# Patient Record
Sex: Female | Born: 1990 | Race: Black or African American | Hispanic: No | Marital: Single | Smoking: Current some day smoker
Health system: Southern US, Community
[De-identification: ages and names within clinical notes are randomized; demographics above are authoritative.]

## PROBLEM LIST (undated history)

## (undated) DIAGNOSIS — Z789 Other specified health status: Secondary | ICD-10-CM

## (undated) HISTORY — PX: NO PAST SURGERIES: SHX2092

---
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
## 2009-04-16 ENCOUNTER — Emergency Department (HOSPITAL_COMMUNITY): Admission: EM | Admit: 2009-04-16 | Discharge: 2009-04-16 | Payer: Self-pay | Admitting: Emergency Medicine

## 2009-07-22 ENCOUNTER — Inpatient Hospital Stay (HOSPITAL_COMMUNITY): Admission: AD | Admit: 2009-07-22 | Discharge: 2009-07-22 | Payer: Self-pay | Admitting: Obstetrics and Gynecology

## 2009-07-22 ENCOUNTER — Ambulatory Visit: Payer: Self-pay | Admitting: Obstetrics and Gynecology

## 2009-10-22 ENCOUNTER — Emergency Department (HOSPITAL_COMMUNITY): Admission: EM | Admit: 2009-10-22 | Discharge: 2009-10-22 | Payer: Self-pay | Admitting: Emergency Medicine

## 2009-12-11 ENCOUNTER — Emergency Department (HOSPITAL_COMMUNITY): Admission: EM | Admit: 2009-12-11 | Discharge: 2009-12-11 | Payer: Self-pay | Admitting: Emergency Medicine

## 2010-01-06 ENCOUNTER — Emergency Department (HOSPITAL_COMMUNITY): Admission: EM | Admit: 2010-01-06 | Discharge: 2010-01-06 | Payer: Self-pay | Admitting: Emergency Medicine

## 2010-04-01 ENCOUNTER — Ambulatory Visit: Payer: Self-pay | Admitting: Obstetrics and Gynecology

## 2010-04-01 ENCOUNTER — Inpatient Hospital Stay (HOSPITAL_COMMUNITY): Admission: AD | Admit: 2010-04-01 | Discharge: 2010-04-01 | Payer: Self-pay | Admitting: Obstetrics and Gynecology

## 2010-10-09 ENCOUNTER — Emergency Department (HOSPITAL_COMMUNITY)
Admission: EM | Admit: 2010-10-09 | Discharge: 2010-10-09 | Disposition: A | Discharge status: Home / Self Care | Payer: Self-pay | Attending: Emergency Medicine | Admitting: Emergency Medicine

## 2010-10-09 DIAGNOSIS — T169XXA Foreign body in ear, unspecified ear, initial encounter: Secondary | ICD-10-CM | POA: Insufficient documentation

## 2010-10-09 DIAGNOSIS — H6120 Impacted cerumen, unspecified ear: Secondary | ICD-10-CM | POA: Insufficient documentation

## 2010-10-09 DIAGNOSIS — IMO0002 Reserved for concepts with insufficient information to code with codable children: Secondary | ICD-10-CM | POA: Insufficient documentation

## 2010-10-10 LAB — URINALYSIS, ROUTINE W REFLEX MICROSCOPIC
Bilirubin Urine: NEGATIVE
Ketones, ur: NEGATIVE mg/dL
Protein, ur: NEGATIVE mg/dL
Urobilinogen, UA: 0.2 mg/dL (ref 0.0–1.0)

## 2010-10-10 LAB — WET PREP, GENITAL
Trich, Wet Prep: NONE SEEN
Yeast Wet Prep HPF POC: NONE SEEN

## 2010-10-10 LAB — POCT PREGNANCY, URINE: Preg Test, Ur: NEGATIVE

## 2010-10-10 LAB — GC/CHLAMYDIA PROBE AMP, GENITAL: GC Probe Amp, Genital: NEGATIVE

## 2010-10-14 LAB — URINALYSIS, ROUTINE W REFLEX MICROSCOPIC
Glucose, UA: NEGATIVE mg/dL
Hgb urine dipstick: NEGATIVE
Ketones, ur: NEGATIVE mg/dL
Nitrite: NEGATIVE
Urobilinogen, UA: 1 mg/dL (ref 0.0–1.0)

## 2010-10-14 LAB — WET PREP, GENITAL

## 2010-10-14 LAB — GC/CHLAMYDIA PROBE AMP, GENITAL: Chlamydia, DNA Probe: NEGATIVE

## 2010-10-14 LAB — URINE MICROSCOPIC-ADD ON

## 2010-10-20 LAB — RAPID STREP SCREEN (MED CTR MEBANE ONLY): Streptococcus, Group A Screen (Direct): NEGATIVE

## 2010-10-28 LAB — POCT PREGNANCY, URINE: Preg Test, Ur: NEGATIVE

## 2010-10-28 LAB — HIV ANTIBODY (ROUTINE TESTING W REFLEX): HIV: NONREACTIVE

## 2010-10-28 LAB — RPR: RPR Ser Ql: NONREACTIVE

## 2010-10-28 LAB — WET PREP, GENITAL

## 2010-10-28 LAB — HSV(HERPES SIMPLEX VRS) I + II AB-IGG: Herpes Simplex Vrs I + II Ab, IgG: 1.3 IV — ABNORMAL HIGH

## 2010-10-28 LAB — GC/CHLAMYDIA PROBE AMP, URINE: Chlamydia, Swab/Urine, PCR: NEGATIVE

## 2011-01-20 ENCOUNTER — Emergency Department (HOSPITAL_COMMUNITY)
Admission: EM | Admit: 2011-01-20 | Discharge: 2011-01-20 | Disposition: A | Discharge status: Home / Self Care | Payer: Self-pay | Attending: Emergency Medicine | Admitting: Emergency Medicine

## 2011-01-20 ENCOUNTER — Emergency Department (HOSPITAL_COMMUNITY): Payer: Self-pay

## 2011-01-20 DIAGNOSIS — T148XXA Other injury of unspecified body region, initial encounter: Secondary | ICD-10-CM | POA: Insufficient documentation

## 2011-01-20 DIAGNOSIS — R079 Chest pain, unspecified: Secondary | ICD-10-CM | POA: Insufficient documentation

## 2011-01-20 DIAGNOSIS — S0180XA Unspecified open wound of other part of head, initial encounter: Secondary | ICD-10-CM | POA: Insufficient documentation

## 2011-01-29 ENCOUNTER — Emergency Department (HOSPITAL_COMMUNITY)
Admission: EM | Admit: 2011-01-29 | Discharge: 2011-01-29 | Disposition: A | Discharge status: Home / Self Care | Payer: Self-pay | Attending: Emergency Medicine | Admitting: Emergency Medicine

## 2011-01-29 DIAGNOSIS — T148XXA Other injury of unspecified body region, initial encounter: Secondary | ICD-10-CM | POA: Insufficient documentation

## 2011-01-29 DIAGNOSIS — Z4802 Encounter for removal of sutures: Secondary | ICD-10-CM | POA: Insufficient documentation

## 2011-04-18 LAB — POCT PREGNANCY, URINE
Operator id: 196461
Preg Test, Ur: NEGATIVE

## 2011-04-18 LAB — GC/CHLAMYDIA PROBE AMP, GENITAL
Chlamydia, DNA Probe: NEGATIVE
GC Probe Amp, Genital: POSITIVE — AB

## 2011-04-18 LAB — URINALYSIS, ROUTINE W REFLEX MICROSCOPIC
Bilirubin Urine: NEGATIVE
Hgb urine dipstick: NEGATIVE
Nitrite: NEGATIVE
Protein, ur: NEGATIVE
Urobilinogen, UA: 1

## 2011-04-18 LAB — RPR: RPR Ser Ql: NONREACTIVE

## 2011-04-24 LAB — URINE CULTURE: Colony Count: 25000

## 2011-04-24 LAB — POCT PREGNANCY, URINE: Preg Test, Ur: NEGATIVE

## 2011-04-24 LAB — URINALYSIS, ROUTINE W REFLEX MICROSCOPIC
Glucose, UA: NEGATIVE
Hgb urine dipstick: NEGATIVE
Protein, ur: NEGATIVE
Specific Gravity, Urine: 1.023
pH: 7

## 2011-04-24 LAB — GC/CHLAMYDIA PROBE AMP, GENITAL: Chlamydia, DNA Probe: NEGATIVE

## 2011-04-30 LAB — GC/CHLAMYDIA PROBE AMP, GENITAL
Chlamydia, DNA Probe: NEGATIVE
GC Probe Amp, Genital: NEGATIVE

## 2011-04-30 LAB — WET PREP, GENITAL
Clue Cells Wet Prep HPF POC: NONE SEEN
Trich, Wet Prep: NONE SEEN

## 2011-09-08 ENCOUNTER — Inpatient Hospital Stay (HOSPITAL_COMMUNITY)
Admission: AD | Admit: 2011-09-08 | Discharge: 2011-09-08 | Disposition: A | Discharge status: Home / Self Care | Payer: Self-pay | Source: Ambulatory Visit | Attending: Obstetrics & Gynecology | Admitting: Obstetrics & Gynecology

## 2011-09-08 DIAGNOSIS — R109 Unspecified abdominal pain: Secondary | ICD-10-CM | POA: Insufficient documentation

## 2011-09-08 DIAGNOSIS — Z5320 Procedure and treatment not carried out because of patient's decision for unspecified reasons: Secondary | ICD-10-CM | POA: Insufficient documentation

## 2011-09-08 LAB — URINALYSIS, ROUTINE W REFLEX MICROSCOPIC
Bilirubin Urine: NEGATIVE
Glucose, UA: NEGATIVE mg/dL
Hgb urine dipstick: NEGATIVE
Protein, ur: NEGATIVE mg/dL
Urobilinogen, UA: 0.2 mg/dL (ref 0.0–1.0)

## 2011-09-08 NOTE — Progress Notes (Signed)
Pt left AMA, paper signed,  states she needs to go back to work, informed that she may return at any point

## 2011-09-08 NOTE — Progress Notes (Signed)
Bad pains in the bottom of stomach, started last wk, pain is off and on.

## 2011-09-23 ENCOUNTER — Inpatient Hospital Stay (HOSPITAL_COMMUNITY)
Admission: AD | Admit: 2011-09-23 | Discharge: 2011-09-23 | Disposition: A | Discharge status: Home / Self Care | Payer: Self-pay | Source: Ambulatory Visit | Attending: Family Medicine | Admitting: Family Medicine

## 2011-09-23 ENCOUNTER — Encounter (HOSPITAL_COMMUNITY): Payer: Self-pay

## 2011-09-23 DIAGNOSIS — J029 Acute pharyngitis, unspecified: Secondary | ICD-10-CM | POA: Insufficient documentation

## 2011-09-23 DIAGNOSIS — R197 Diarrhea, unspecified: Secondary | ICD-10-CM | POA: Insufficient documentation

## 2011-09-23 HISTORY — DX: Other specified health status: Z78.9

## 2011-09-23 LAB — URINE MICROSCOPIC-ADD ON

## 2011-09-23 LAB — URINALYSIS, ROUTINE W REFLEX MICROSCOPIC
Bilirubin Urine: NEGATIVE
Glucose, UA: NEGATIVE mg/dL
Ketones, ur: NEGATIVE mg/dL
Specific Gravity, Urine: 1.015 (ref 1.005–1.030)
pH: 6 (ref 5.0–8.0)

## 2011-09-23 MED ORDER — AMOXICILLIN 500 MG PO CAPS: 500.0000 mg | ORAL_CAPSULE | Freq: Three times a day (TID) | ORAL | Status: AC

## 2011-09-23 NOTE — Discharge Instructions (Signed)

## 2011-09-23 NOTE — ED Provider Notes (Signed)
Chart reviewed and agree with management and plan.  

## 2011-09-23 NOTE — Progress Notes (Signed)
Pt states has had sore throat x1 week, and "sinus like" symptoms since Friday. Has taken sinus medication and advil since Friday but states it hasn't helped. This morning started having sharp lower abdominal pain. Denies vaginal bleeding or vaginal discharge.

## 2011-09-23 NOTE — Progress Notes (Signed)
Patient states she has had a sore throat for about one week, no fever and slight cough. Started having lower abdominal pain this morning. Denies any bleeding. Had cold like symptoms.

## 2011-09-23 NOTE — ED Provider Notes (Signed)
History   Pt presents today c/o a sore throat that has worsened over the past week. She denies fever or N&V. She did have one episode of diarrhea this am which is when she noticed some abd cramping. She denies pain in her pelvis. She denies vag dc, bleeding, or any other sx at this time.  Chief Complaint  Patient presents with  . Sore Throat  . Abdominal Pain   HPI  OB History    Grav Para Term Preterm Abortions TAB SAB Ect Mult Living   0 0 0 0 0 0 0 0 0 0       Past Medical History  Diagnosis Date  . No pertinent past medical history     Past Surgical History  Procedure Date  . No past surgeries     Family History  Problem Relation Age of Onset  . Anesthesia problems Neg Hx     History  Substance Use Topics  . Smoking status: Current Everyday Smoker    Types: Cigarettes  . Smokeless tobacco: Not on file  . Alcohol Use:     Allergies: No Known Allergies  No prescriptions prior to admission    Review of Systems  Constitutional: Negative for fever and chills.  HENT: Positive for sore throat.   Eyes: Negative for blurred vision and double vision.  Respiratory: Negative for cough, hemoptysis, sputum production, shortness of breath and wheezing.   Cardiovascular: Negative for chest pain and palpitations.  Gastrointestinal: Positive for abdominal pain and diarrhea. Negative for nausea, vomiting, constipation and blood in stool.  Genitourinary: Negative for dysuria, urgency, frequency and hematuria.  Neurological: Negative for dizziness and headaches.  Psychiatric/Behavioral: Negative for depression and suicidal ideas.   Physical Exam   Blood pressure 126/78, pulse 69, temperature 97.2 F (36.2 C), temperature source Oral, resp. rate 16, height 5\' 6"  (1.676 m), weight 184 lb 9.6 oz (83.734 kg), last menstrual period 09/18/2011, SpO2 97.00%.  Physical Exam  Nursing note and vitals reviewed. Constitutional: She is oriented to person, place, and time. She appears  well-developed and well-nourished. No distress.  HENT:  Head: Normocephalic and atraumatic.  Mouth/Throat: Uvula is midline. Oropharyngeal exudate and posterior oropharyngeal erythema present.  Eyes: Conjunctivae and EOM are normal. Pupils are equal, round, and reactive to light.  Cardiovascular: Normal rate, regular rhythm and normal heart sounds.  Exam reveals no gallop and no friction rub.   No murmur heard. Respiratory: Effort normal and breath sounds normal. No respiratory distress. She has no wheezes. She has no rales. She exhibits no tenderness.  GI: Soft. She exhibits no distension and no mass. There is no tenderness. There is no rebound and no guarding.  Neurological: She is alert and oriented to person, place, and time.  Skin: Skin is warm and dry. She is not diaphoretic.  Psychiatric: She has a normal mood and affect. Her behavior is normal. Judgment and thought content normal.    MAU Course  Procedures  Results for orders placed during the hospital encounter of 09/23/11 (from the past 72 hour(s))  POCT PREGNANCY, URINE     Status: Normal   Collection Time   09/23/11 10:57 AM      Component Value Range Comment   Preg Test, Ur NEGATIVE  NEGATIVE       Assessment and Plan  Pharyngitis: discussed with pt at length. Will tx with amoxicillin. She will f/u with her PCP. She may take Imodium for diarrhea. Discussed diet, activity, risks, and precautions.  Clinton Gallant.  Cheril Slattery III, DrHSc, MPAS, PA-C  09/23/2011, 10:57 AM   Henrietta Hoover, PA 09/23/11 1119

## 2012-03-09 ENCOUNTER — Inpatient Hospital Stay (HOSPITAL_COMMUNITY)
Admission: AD | Admit: 2012-03-09 | Discharge: 2012-03-09 | Disposition: A | Discharge status: Home / Self Care | Payer: Self-pay | Source: Ambulatory Visit | Attending: Obstetrics & Gynecology | Admitting: Obstetrics & Gynecology

## 2012-03-09 ENCOUNTER — Encounter (HOSPITAL_COMMUNITY): Payer: Self-pay | Admitting: *Deleted

## 2012-03-09 ENCOUNTER — Inpatient Hospital Stay (HOSPITAL_COMMUNITY): Payer: Self-pay

## 2012-03-09 DIAGNOSIS — S2341XA Sprain of ribs, initial encounter: Secondary | ICD-10-CM | POA: Insufficient documentation

## 2012-03-09 DIAGNOSIS — T148XXA Other injury of unspecified body region, initial encounter: Secondary | ICD-10-CM

## 2012-03-09 DIAGNOSIS — R109 Unspecified abdominal pain: Secondary | ICD-10-CM | POA: Insufficient documentation

## 2012-03-09 LAB — URINALYSIS, ROUTINE W REFLEX MICROSCOPIC
Bilirubin Urine: NEGATIVE
Hgb urine dipstick: NEGATIVE
Nitrite: NEGATIVE
Protein, ur: NEGATIVE mg/dL
Specific Gravity, Urine: 1.02 (ref 1.005–1.030)
Urobilinogen, UA: 1 mg/dL (ref 0.0–1.0)

## 2012-03-09 LAB — URINE MICROSCOPIC-ADD ON

## 2012-03-09 MED ORDER — OXYCODONE-ACETAMINOPHEN 5-325 MG PO TABS: 2.0000 | ORAL_TABLET | ORAL | Status: AC | PRN

## 2012-03-09 MED ORDER — OXYCODONE-ACETAMINOPHEN 5-325 MG PO TABS
1.0000 | ORAL_TABLET | Freq: Once | ORAL | Status: AC
  Administered 2012-03-09: 1 via ORAL
  Filled 2012-03-09: qty 1

## 2012-03-09 NOTE — MAU Provider Note (Signed)
History     CSN: [id number]  Arrival date and time: 03/09/12 2009   First Provider Initiated Contact with Patient 03/09/12 2128      Chief Complaint  Patient presents with  . Abdominal Pain   HPI Elizabeth Lynn is a 21 y.o. female who presents to MAU with right rib pain. She is not pregnant. The pain started last night. She describes the pain as sharp. The pain is worse with deep breath, pressure to the area and movement. The pain is better with remaining still. The patient rates the pain as 8/10. 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 The history was provided by the patient.  OB History    Grav Para Term Preterm Abortions TAB SAB Ect Mult Living   0 0 0 0 0 0 0 0 0 0       Past Medical History  Diagnosis Date  . No pertinent past medical history     Past Surgical History  Procedure Date  . No past surgeries     Family History  Problem Relation Age of Onset  . Anesthesia problems Neg Hx     History  Substance Use Topics  . Smoking status: Current Everyday Smoker    Types: Cigarettes  . Smokeless tobacco: Not on file  . Alcohol Use: No    Allergies: No Known Allergies  No prescriptions prior to admission    Review of Systems  Constitutional: Negative for fever, chills and weight loss.  HENT: Negative for ear pain, nosebleeds, congestion and sore throat.   Eyes: Negative for blurred vision, double vision, photophobia and pain.  Respiratory: Negative for cough, shortness of breath and wheezing.   Cardiovascular: Negative for chest pain, palpitations and leg swelling.  Gastrointestinal: Negative for heartburn, nausea, vomiting, abdominal pain, diarrhea and constipation.  Genitourinary: Negative for dysuria, urgency and frequency.  Musculoskeletal:       Right rib pain  Skin: Negative for itching and rash.  Neurological: Negative for dizziness, sensory change,  speech change, seizures, weakness and headaches.  Endo/Heme/Allergies: Does not bruise/bleed easily.  Psychiatric/Behavioral: Negative for depression. The patient is not nervous/anxious.    Physical Exam   Blood pressure 125/66, pulse 98, temperature 99.7 F (37.6 C), temperature source Oral, resp. rate 18, height 5\' 6"  (1.676 m), weight 186 lb (84.369 kg), last menstrual period 02/19/2012, SpO2 100.00%.  Physical Exam  Nursing note and vitals reviewed. Constitutional: She is oriented to person, place, and time. She appears well-developed and well-nourished. No distress.  HENT:  Head: Normocephalic and atraumatic.  Eyes: EOM are normal.  Neck: Neck supple.  Cardiovascular: Normal rate, regular rhythm and normal heart sounds.   Respiratory: Effort normal. No respiratory distress. She has no wheezes. She has no rales. She exhibits tenderness.    GI: Soft. There is no tenderness.  Musculoskeletal: Normal range of motion.  Neurological: She is alert and oriented to person, place, and time.  Skin: Skin is warm and dry.  Psychiatric: She has a normal mood and affect. Her behavior is normal. Judgment and thought content normal.   Assessment: Muscular strain  Plan:  Ibuprofen   Percocet   If pain continues go to [facility] or [facility] ED for further evaluation  MAU Course  Procedures Dg Ribs Unilateral W/chest Right  03/09/2012  *RADIOLOGY REPORT*  Clinical Data: No injury.  Sudden onset right lower  anterior rib pain.  RIGHT RIBS AND CHEST - 3+ VIEW  Comparison: 01/20/2011.  Findings: No displaced rib fractures identified.  Umbilical adornment noted.  No pneumothorax.  No airspace disease or effusion.  Cardiopericardial silhouette appears within normal limits.  IMPRESSION: No acute abnormality.  Original Report Authenticated By: Andreas Newport, M.D.   Medication List  As of 03/09/2012 10:43 PM   START taking these medications         oxyCODONE-acetaminophen 5-325 MG per tablet    Commonly known as: PERCOCET/ROXICET   Take 2 tablets by mouth every 4 (four) hours as needed for pain.          Where to get your medications    These are the prescriptions that you need to pick up.   You may get these medications from any pharmacy.         oxyCODONE-acetaminophen 5-325 MG per tablet           Follow-up Information    Please follow up. (with your doctor)         Kerrie Buffalo, RN, FNP, [facility] 03/09/2012, 9:41 PM

## 2012-03-09 NOTE — MAU Note (Signed)
Pt reports pains since last pm, RUQ. Pain is radiating to right chest area. Denies cough. Denies nausea, vomiting , diarrhea, or fever. LMP 02/19/2012

## 2012-03-10 NOTE — MAU Provider Note (Signed)
Attestation of Attending Supervision of Advanced Practitioner (CNM/NP): Evaluation and management procedures were performed by the Advanced Practitioner under my supervision and collaboration.  I have reviewed the Advanced Practitioner's note and chart, and I agree with the management and plan.  HARRAWAY-SMITH, Shaden Lacher 7:08 AM     

## 2013-04-11 ENCOUNTER — Inpatient Hospital Stay (HOSPITAL_COMMUNITY)
Admission: AD | Admit: 2013-04-11 | Discharge: 2013-04-11 | Disposition: A | Discharge status: Home / Self Care | Payer: Self-pay | Source: Ambulatory Visit | Attending: Obstetrics and Gynecology | Admitting: Obstetrics and Gynecology

## 2013-04-11 ENCOUNTER — Encounter (HOSPITAL_COMMUNITY): Payer: Self-pay | Admitting: *Deleted

## 2013-04-11 DIAGNOSIS — N390 Urinary tract infection, site not specified: Secondary | ICD-10-CM | POA: Insufficient documentation

## 2013-04-11 DIAGNOSIS — R350 Frequency of micturition: Secondary | ICD-10-CM | POA: Insufficient documentation

## 2013-04-11 DIAGNOSIS — Z202 Contact with and (suspected) exposure to infections with a predominantly sexual mode of transmission: Secondary | ICD-10-CM | POA: Insufficient documentation

## 2013-04-11 DIAGNOSIS — N921 Excessive and frequent menstruation with irregular cycle: Secondary | ICD-10-CM | POA: Insufficient documentation

## 2013-04-11 DIAGNOSIS — R300 Dysuria: Secondary | ICD-10-CM | POA: Insufficient documentation

## 2013-04-11 LAB — WET PREP, GENITAL
Trich, Wet Prep: NONE SEEN
Yeast Wet Prep HPF POC: NONE SEEN

## 2013-04-11 LAB — URINALYSIS, ROUTINE W REFLEX MICROSCOPIC
Nitrite: NEGATIVE
Protein, ur: NEGATIVE mg/dL
Specific Gravity, Urine: 1.02 (ref 1.005–1.030)
Urobilinogen, UA: 0.2 mg/dL (ref 0.0–1.0)

## 2013-04-11 LAB — URINE MICROSCOPIC-ADD ON

## 2013-04-11 LAB — POCT PREGNANCY, URINE: Preg Test, Ur: NEGATIVE

## 2013-04-11 MED ORDER — CIPROFLOXACIN HCL 500 MG PO TABS: 500.0000 mg | ORAL_TABLET | Freq: Two times a day (BID) | ORAL | Status: DC

## 2013-04-11 NOTE — MAU Note (Signed)
Patient states she needs to leave NOW and asked to have lab results called to her if abnormal and request Rx for UTI be sent to her pharmacy. Ivonne Andrew CNM notified.

## 2013-04-11 NOTE — MAU Note (Signed)
PT SAYS SHE THINKS  SHE HAS A UTI-  CONSTANTLY VOIDING AND  HURTS.      NO DR.   LAST SEX-  YESTERDAY.    ON DEPO-  GOT 9-2-  AT HD.

## 2013-04-11 NOTE — MAU Provider Note (Signed)
Chief Complaint: No chief complaint on file.   First Provider Initiated Contact with Patient 04/11/13 2225      SUBJECTIVE HPI: Elizabeth Lynn is a 22 y.o. G0P0000 female who presents with burning urination, frequency and possible hematuria x1-1/2 weeks and vaginal discomfort with intercourse. States she is in a mutually monogamous relationship. On Depo-Provera.  Denies fever, chills, vaginal discharge, dyspareunia, flank pain. Has intermenstrual spotting, but that is normal for her while on Depo-Provera. Requesting STD testing.  Past Medical History  Diagnosis Date  . No pertinent past medical history    OB History  Gravida Para Term Preterm AB SAB TAB Ectopic Multiple Living  0 0 0 0 0 0 0 0 0 0        Past Surgical History  Procedure Laterality Date  . No past surgeries     History   Social History  . Marital Status: Single    Spouse Name: N/A    Number of Children: N/A  . Years of Education: N/A   Occupational History  . Not on file.   Social History Main Topics  . Smoking status: Current Every Day Smoker    Types: Cigarettes  . Smokeless tobacco: Not on file  . Alcohol Use: No  . Drug Use: No  . Sexual Activity: Yes    Birth Control/ Protection: None   Other Topics Concern  . Not on file   Social History Narrative  . No narrative on file   No current facility-administered medications on file prior to encounter.   No current outpatient prescriptions on file prior to encounter.   No Known Allergies  ROS: Pertinent items in HPI.  OBJECTIVE Blood pressure 114/70, pulse 75, temperature 98.1 F (36.7 C), temperature source Oral, resp. rate 20, height 5\' 6"  (1.676 m), weight 87.714 kg (193 lb 6 oz), last menstrual period 03/28/2013. GENERAL: Well-developed, well-nourished female in no acute distress.  HEENT: Normocephalic HEART: normal rate RESP: normal effort ABDOMEN: Soft, non-tender. No CVA tenderness. EXTREMITIES: Nontender, no edema NEURO: Alert and  oriented SPECULUM EXAM: NEFG, scant, mildly malodorous blood noted, cervix clean BIMANUAL: cervix close; uterus normal size, no adnexal tenderness or masses. No CMT.  LAB RESULTS Results for orders placed during the hospital encounter of 04/11/13 (from the past 24 hour(s))  URINALYSIS, ROUTINE W REFLEX MICROSCOPIC     Status: Abnormal   Collection Time    04/11/13  7:34 PM      Result Value Range   Color, Urine STRAW (*) YELLOW   APPearance CLEAR  CLEAR   Specific Gravity, Urine 1.020  1.005 - 1.030   pH 6.0  5.0 - 8.0   Glucose, UA NEGATIVE  NEGATIVE mg/dL   Hgb urine dipstick MODERATE (*) NEGATIVE   Bilirubin Urine NEGATIVE  NEGATIVE   Ketones, ur NEGATIVE  NEGATIVE mg/dL   Protein, ur NEGATIVE  NEGATIVE mg/dL   Urobilinogen, UA 0.2  0.0 - 1.0 mg/dL   Nitrite NEGATIVE  NEGATIVE   Leukocytes, UA TRACE (*) NEGATIVE  URINE MICROSCOPIC-ADD ON     Status: None   Collection Time    04/11/13  7:34 PM      Result Value Range   Squamous Epithelial / LPF RARE  RARE   WBC, UA 3-6  <3 WBC/hpf   RBC / HPF 3-6  <3 RBC/hpf   Bacteria, UA RARE  RARE  POCT PREGNANCY, URINE     Status: None   Collection Time    04/11/13  8:01 PM  Result Value Range   Preg Test, Ur NEGATIVE  NEGATIVE    IMAGING No results found.  MAU COURSE  ASSESSMENT 1. UTI (lower urinary tract infection)   2. Possible exposure to STD     PLAN Discharge home in stable condition. Wet prep, GC Chlamydia pending.  Increase fluids. Always use condoms.     Follow-up Information   Follow up with [facility] [place]. (As needed for remainder of STD testing)    Contact information:   [address] [place] [address] [phone]      Follow up with [facility]. (As needed if symptoms worsen)    Contact information:   [address] [id number] [place] [address] [phone]       Medication List         ciprofloxacin 500 MG tablet   Commonly known as:  CIPRO  Take 1 tablet (500 mg total) by mouth 2 (two) times daily.         [place], CNM 04/11/2013  11:18 PM

## 2013-04-12 LAB — GC/CHLAMYDIA PROBE AMP: CT Probe RNA: NEGATIVE

## 2013-04-12 NOTE — MAU Provider Note (Signed)
Attestation of Attending Supervision of Advanced Practitioner (CNM/NP): Evaluation and management procedures were performed by the Advanced Practitioner under my supervision and collaboration.  I have reviewed the Advanced Practitioner's note and chart, and I agree with the management and plan.  Amar Sippel 04/12/2013 3:43 AM

## 2013-05-22 ENCOUNTER — Encounter (HOSPITAL_COMMUNITY): Payer: Self-pay | Admitting: Emergency Medicine

## 2013-05-22 ENCOUNTER — Emergency Department (HOSPITAL_COMMUNITY)
Admission: EM | Admit: 2013-05-22 | Discharge: 2013-05-22 | Disposition: A | Discharge status: Home / Self Care | Payer: Self-pay | Attending: Emergency Medicine | Admitting: Emergency Medicine

## 2013-05-22 DIAGNOSIS — R059 Cough, unspecified: Secondary | ICD-10-CM | POA: Insufficient documentation

## 2013-05-22 DIAGNOSIS — R05 Cough: Secondary | ICD-10-CM | POA: Insufficient documentation

## 2013-05-22 DIAGNOSIS — R51 Headache: Secondary | ICD-10-CM | POA: Insufficient documentation

## 2013-05-22 DIAGNOSIS — F17200 Nicotine dependence, unspecified, uncomplicated: Secondary | ICD-10-CM | POA: Insufficient documentation

## 2013-05-22 MED ORDER — HYDROCODONE-ACETAMINOPHEN 7.5-325 MG/15ML PO SOLN: 15.0000 mL | Freq: Four times a day (QID) | ORAL | Status: AC | PRN

## 2013-05-22 NOTE — ED Notes (Signed)
Pt states she has had sore throat, runny nose, cough, headaches since Monday. The pt took alka seltzer for her symptoms, which she states helped at first but has stopped taking due to it tasting bad. Denies SOB, fever, chest pain.

## 2013-05-22 NOTE — ED Notes (Signed)
Bed: ZO10 Expected date:  Expected time:  Means of arrival:  Comments: Triaging pt

## 2013-05-22 NOTE — ED Provider Notes (Signed)
CSN: [id number]     Arrival date & time 05/22/13  1327 History   First MD Initiated Contact with Patient 05/22/13 1340     Chief Complaint  Patient presents with  . Sore Throat   (Consider location/radiation/quality/duration/timing/severity/associated sxs/prior Treatment) HPI Patient presents with concerns of cough, sore throat, headache, generalized discomfort. Symptoms began approximately 4 days ago.  Since onset symptoms have been persistent, with minimal relief from Alka-Seltzer.  She states that this medication is poorly, and is not currently taking anything. She denies fever, vomiting, diarrhea, abdominal pain, chest pain, dyspnea. Patient is generally well.  She does endorse smoking half pack per day. She was counseled on the need to stop smoking.  Past Medical History  Diagnosis Date  . No pertinent past medical history    Past Surgical History  Procedure Laterality Date  . No past surgeries     Family History  Problem Relation Age of Onset  . Anesthesia problems Neg Hx    History  Substance Use Topics  . Smoking status: Current Some Day Smoker    Types: Cigarettes  . Smokeless tobacco: Not on file  . Alcohol Use: Yes   OB History   Grav Para Term Preterm Abortions TAB SAB Ect Mult Living   0 0 0 0 0 0 0 0 0 0      Review of Systems  Constitutional:       Per HPI, otherwise negative  HENT:       Per HPI, otherwise negative  Respiratory:       Per HPI, otherwise negative  Cardiovascular:       Per HPI, otherwise negative  Gastrointestinal: Negative for vomiting.  Endocrine:       Negative aside from HPI  Genitourinary:       Neg aside from HPI   Musculoskeletal:       Per HPI, otherwise negative  Skin: Negative.   Neurological: Negative for syncope.    Allergies  Review of patient's allergies indicates no known allergies.  Home Medications   Current Outpatient Rx  Name  Route  Sig  Dispense  Refill  . medroxyPROGESTERone (DEPO-PROVERA) 150  MG/ML injection   Intramuscular   Inject 150 mg into the muscle every 3 (three) months. August/September 2014          BP 135/82  Pulse 86  Temp(Src) 98.1 F (36.7 C) (Oral)  Resp 18  SpO2 98% Physical Exam  Nursing note and vitals reviewed. Constitutional: She is oriented to person, place, and time. She appears well-developed and well-nourished. No distress.  HENT:  Head: Normocephalic and atraumatic.  Nose: Nose normal.  Mouth/Throat: Oropharynx is clear and moist. No oropharyngeal exudate.  Eyes: Conjunctivae and EOM are normal.  Cardiovascular: Normal rate and regular rhythm.   Pulmonary/Chest: Effort normal and breath sounds normal. No stridor. No respiratory distress.  Abdominal: She exhibits no distension.  Musculoskeletal: She exhibits no edema.  Neurological: She is alert and oriented to person, place, and time. No cranial nerve deficit.  Skin: Skin is warm and dry.  Psychiatric: She has a normal mood and affect.    ED Course  Procedures (including critical care time) Labs Review Labs Reviewed - No data to display Imaging Review No results found.  EKG Interpretation   None       MDM  No diagnosis found. This generally well female presents with several days of URI like symptoms.  On exam she is awake and alert and hemodynamically stable, afebrile,  and in no distress.  Patient's oropharynx is clear, and with no evidence of hypoxia, tachypnea, tachycardia, abdominal or chest pain, there is low suspicion for acute systemic pathology.  Patient's presentation is most consistent with URI.  Patient was started on a new course of medication, provided the exposed return precautions, follow up instructions, and appropriate for discharge with outpatient followup.    Gerhard Munch, MD 05/22/13 [phone]

## 2013-05-22 NOTE — ED Notes (Signed)
Pt c/o sore throat times four days. Pt states hurts little to swallow.

## 2013-08-13 IMAGING — CR DG RIBS W/ CHEST 3+V*R*
5 series · 5 of 5 positions shown · non-contrast
Comparison: 01/20/2011.

CLINICAL DATA: No injury.  Sudden onset right lower anterior rib
pain.

RIGHT RIBS AND CHEST - 3+ VIEW

[view not recorded (1 of 5)]
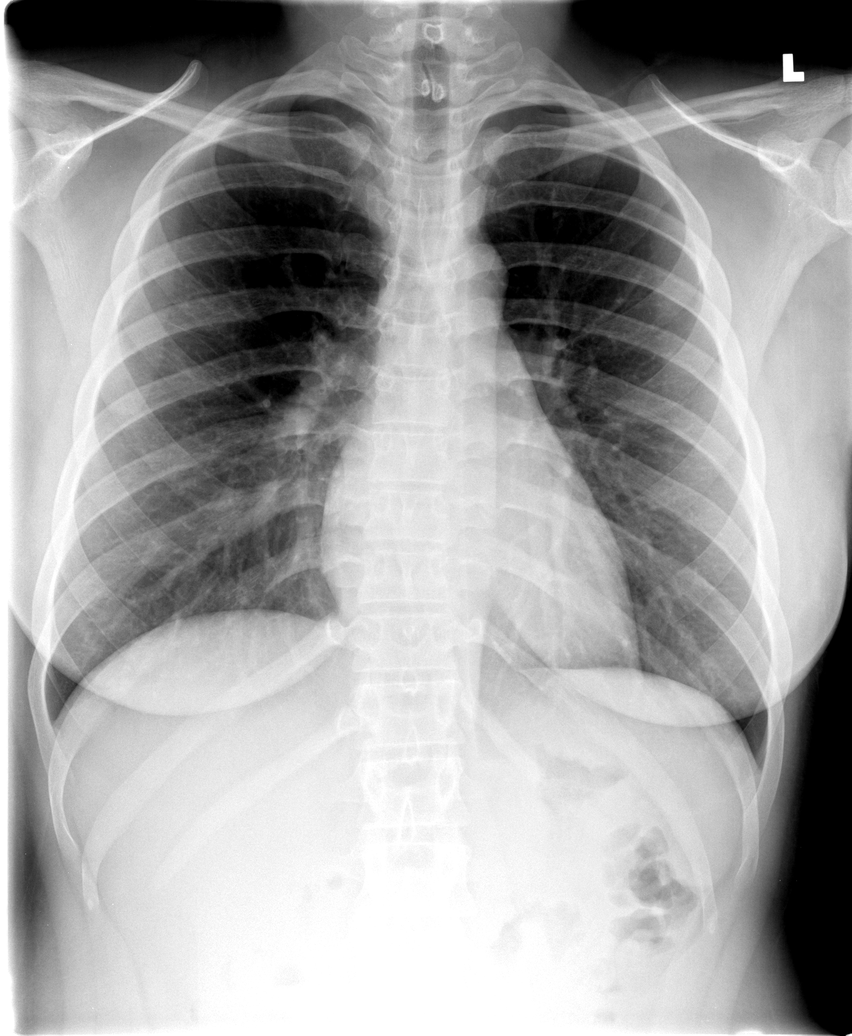

[view not recorded (2 of 5)]
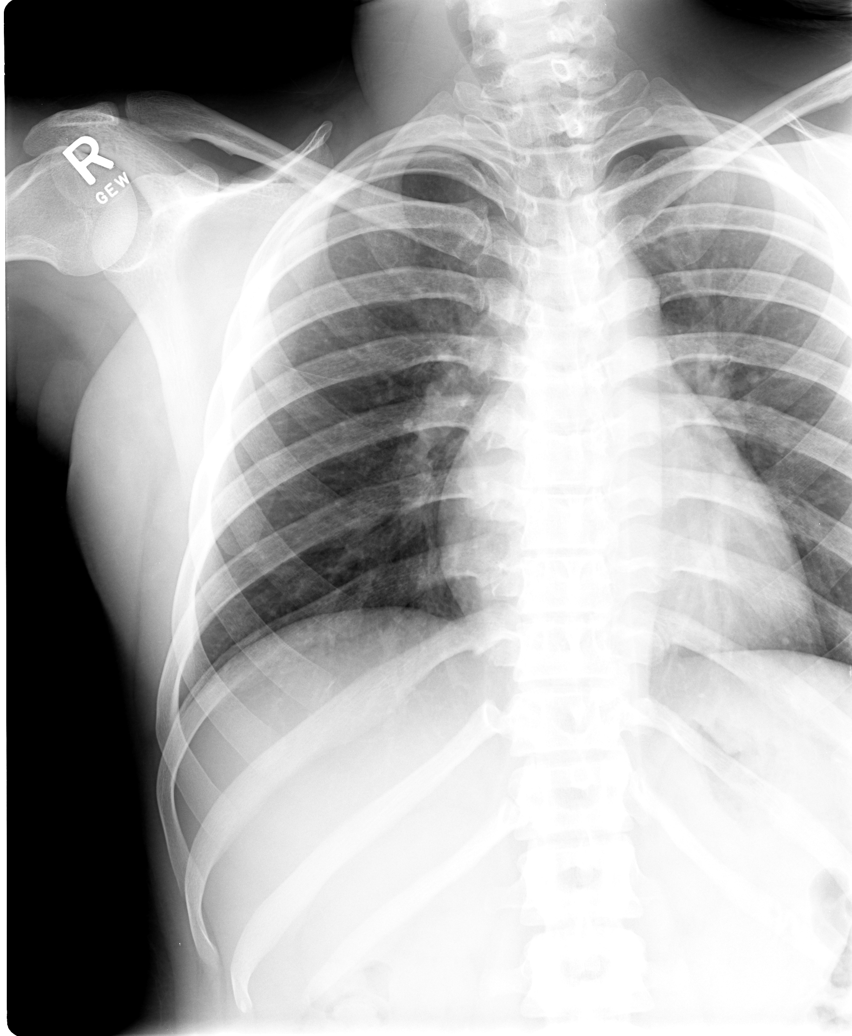

[view not recorded (3 of 5)]
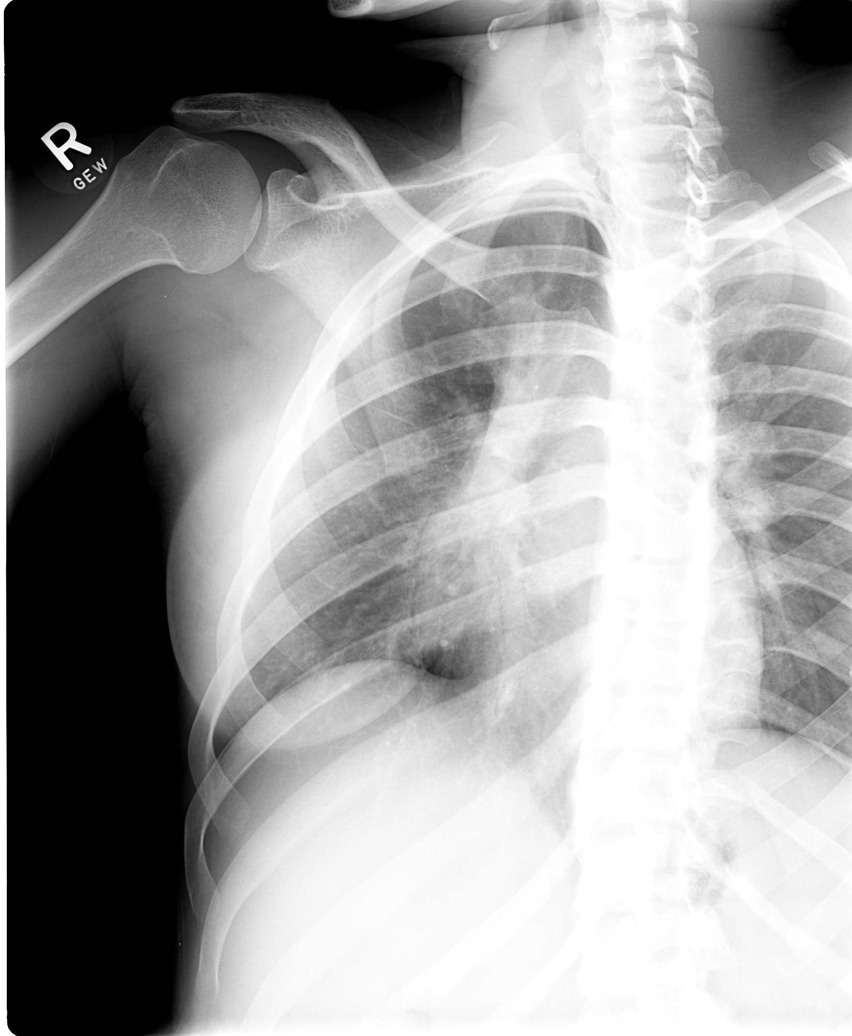

[view not recorded (4 of 5)]
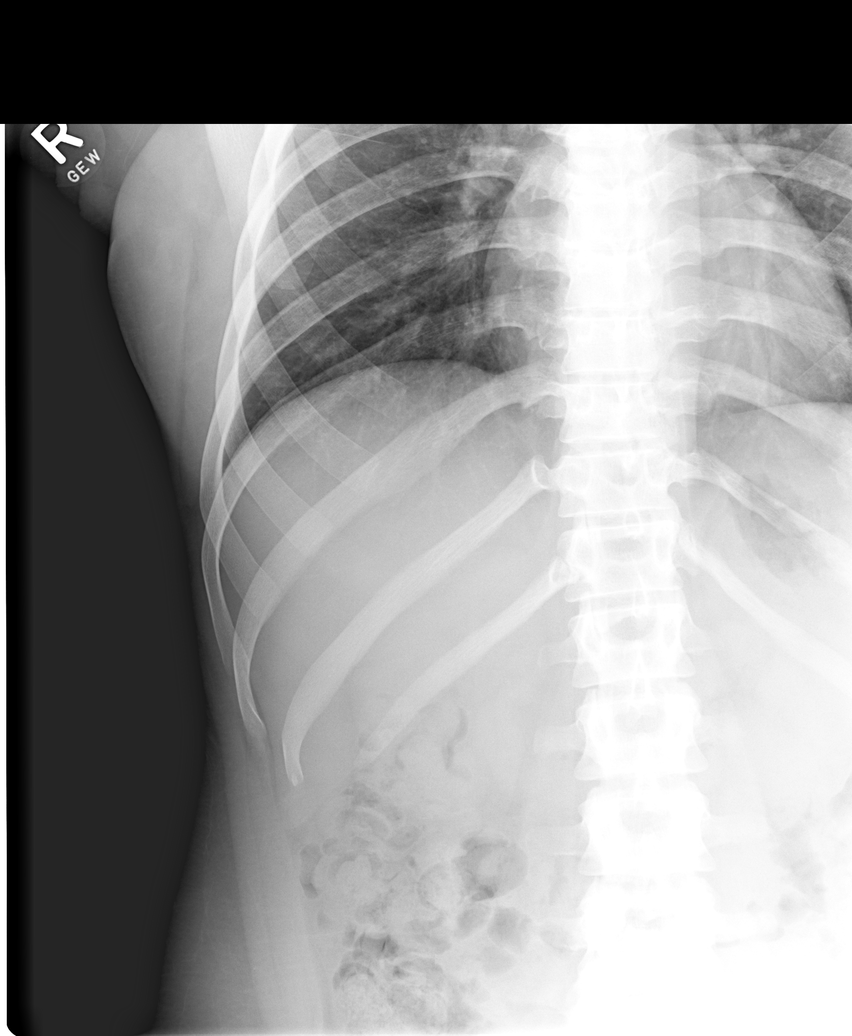

[view not recorded (5 of 5)]
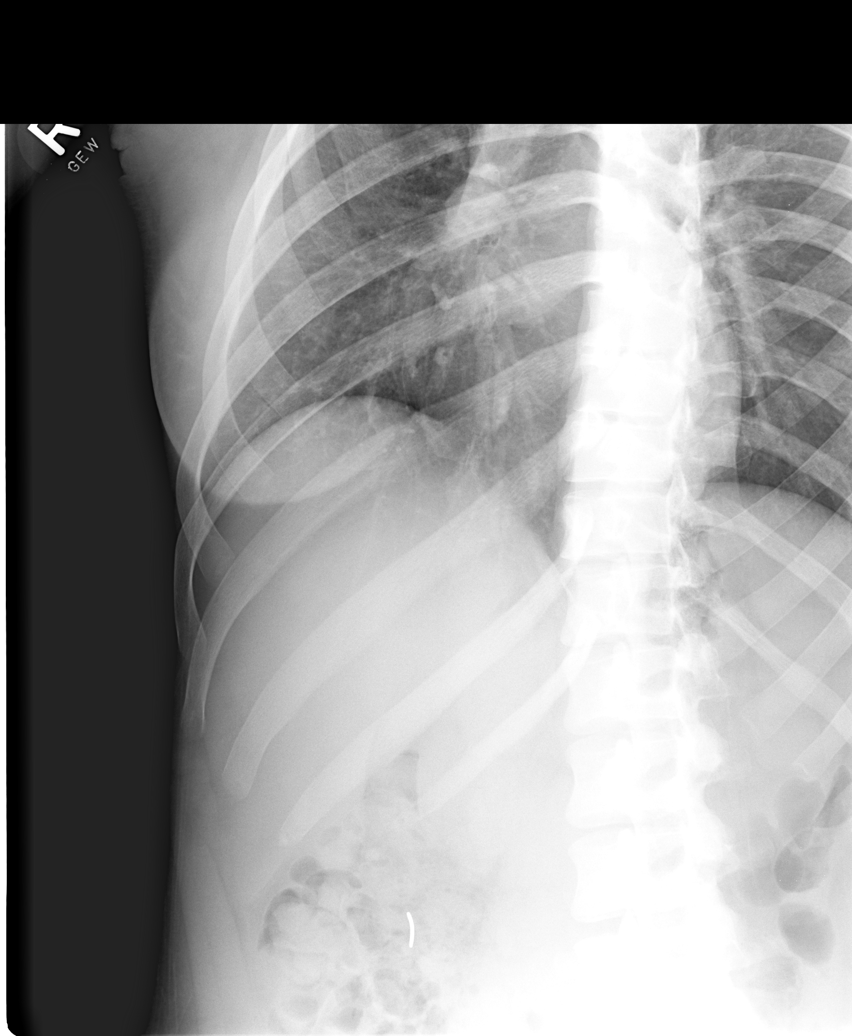

[5 of 5 positions shown; findings below may reference images not displayed]

FINDINGS: No displaced rib fractures identified.  Umbilical
adornment noted.  No pneumothorax.  No airspace disease or
effusion.  Cardiopericardial silhouette appears within normal
limits.
IMPRESSION: No acute abnormality.

## 2014-02-01 ENCOUNTER — Emergency Department (HOSPITAL_COMMUNITY)
Admission: EM | Admit: 2014-02-01 | Discharge: 2014-02-01 | Disposition: A | Discharge status: Home / Self Care | Payer: Self-pay | Attending: Emergency Medicine | Admitting: Emergency Medicine

## 2014-02-01 ENCOUNTER — Encounter (HOSPITAL_COMMUNITY): Payer: Self-pay | Admitting: Emergency Medicine

## 2014-02-01 DIAGNOSIS — L309 Dermatitis, unspecified: Secondary | ICD-10-CM

## 2014-02-01 DIAGNOSIS — L259 Unspecified contact dermatitis, unspecified cause: Secondary | ICD-10-CM | POA: Insufficient documentation

## 2014-02-01 DIAGNOSIS — F17200 Nicotine dependence, unspecified, uncomplicated: Secondary | ICD-10-CM | POA: Insufficient documentation

## 2014-02-01 DIAGNOSIS — IMO0002 Reserved for concepts with insufficient information to code with codable children: Secondary | ICD-10-CM | POA: Insufficient documentation

## 2014-02-01 MED ORDER — HYDROCORTISONE 1 % EX CREA: TOPICAL_CREAM | CUTANEOUS | Status: AC

## 2014-02-01 NOTE — ED Notes (Addendum)
Pt c/o rash on R arm x 2-3 days.  Denies pain.  Reports severe itching.  Reports using a new body wash.  Sts "I've had the same rash in the same place a while ago."

## 2014-02-01 NOTE — ED Notes (Signed)
MD at bedside. 

## 2014-02-01 NOTE — ED Provider Notes (Signed)
CSN: [id number]     Arrival date & time 02/01/14  0740 History   First MD Initiated Contact with Patient 02/01/14 [phone]     Chief Complaint  Patient presents with  . Rash     (Consider location/radiation/quality/duration/timing/severity/associated sxs/prior Treatment) HPI Comments: She presented to the ER for evaluation of a 2 rash on the inside portion of the right elbow. Patient does report that she has had similar rashes in the past, thinks it might of been eczema. No rashes no rales on her body. No redness or drainage. No new skin products or foods/meds.  Patient is a 23 y.o. female presenting with rash.  Rash Associated symptoms: no shortness of breath     Past Medical History  Diagnosis Date  . No pertinent past medical history    Past Surgical History  Procedure Laterality Date  . No past surgeries     Family History  Problem Relation Age of Onset  . Anesthesia problems Neg Hx    History  Substance Use Topics  . Smoking status: Current Some Day Smoker    Types: Cigarettes  . Smokeless tobacco: Not on file  . Alcohol Use: Yes   OB History   Grav Para Term Preterm Abortions TAB SAB Ect Mult Living   0 0 0 0 0 0 0 0 0 0      Review of Systems  HENT: Negative for facial swelling and trouble swallowing.   Respiratory: Negative for shortness of breath.   Skin: Positive for rash.      Allergies  Review of patient's allergies indicates no known allergies.  Home Medications   Prior to Admission medications   Medication Sig Start Date End Date Taking? Authorizing Provider  HYDROcodone-acetaminophen (HYCET) 7.5-325 mg/15 ml solution Take 15 mLs by mouth every 6 (six) hours as needed for pain or cough. 05/22/13 05/22/14  Gerhard Munchobert Lockwood, MD  hydrocortisone cream 1 % Apply to affected area 2 times daily 02/01/14   Gilda Creasehristopher J. Summers Buendia, MD  medroxyPROGESTERone (DEPO-PROVERA) 150 MG/ML injection Inject 150 mg into the muscle every 3 (three) months. August/September 2014     Historical Provider, MD   There were no vitals taken for this visit. Physical Exam  Constitutional: She appears well-developed and well-nourished.  HENT:  Head: Normocephalic.  Eyes: Pupils are equal, round, and reactive to light.  Neck: Normal range of motion. Neck supple.  Cardiovascular: Normal rate and regular rhythm.   Pulmonary/Chest: Effort normal and breath sounds normal.  Skin:  Slightly raised, circular patch right antecubital fossa    ED Course  Procedures (including critical care time) Labs Review Labs Reviewed - No data to display  Imaging Review No results found.   EKG Interpretation None      MDM   Final diagnoses:  Eczema   Presents to ER for evaluation of itchy patch in the right antecubital fossa. She does have a history of eczema in the past and this does look similar to eczema rash. Patient will be treated with hydrocortisone and moisturizer.    Gilda Creasehristopher J. Roderic Lammert, MD 02/01/14 [phone]

## 2014-02-01 NOTE — Discharge Instructions (Signed)
Eczema Eczema, also called atopic dermatitis, is a skin disorder that causes inflammation of the skin. It causes a red rash and dry, scaly skin. The skin becomes very itchy. Eczema is generally worse during the cooler winter months and often improves with the warmth of summer. Eczema usually starts showing signs in infancy. Some children outgrow eczema, but it may last through adulthood.  CAUSES  The exact cause of eczema is not known, but it appears to run in families. People with eczema often have a family history of eczema, allergies, asthma, or hay fever. Eczema is not contagious. Flare-ups of the condition may be caused by:   Contact with something you are sensitive or allergic to.   Stress. SIGNS AND SYMPTOMS  Dry, scaly skin.   Red, itchy rash.   Itchiness. This may occur before the skin rash and may be very intense.  DIAGNOSIS  The diagnosis of eczema is usually made based on symptoms and medical history. TREATMENT  Eczema cannot be cured, but symptoms usually can be controlled with treatment and other strategies. A treatment plan might include:  Controlling the itching and scratching.   Use over-the-counter antihistamines as directed for itching. This is especially useful at night when the itching tends to be worse.   Use over-the-counter steroid creams as directed for itching.   Avoid scratching. Scratching makes the rash and itching worse. It may also result in a skin infection (impetigo) due to a break in the skin caused by scratching.   Keeping the skin well moisturized with creams every day. This will seal in moisture and help prevent dryness. Lotions that contain alcohol and water should be avoided because they can dry the skin.   Limiting exposure to things that you are sensitive or allergic to (allergens).   Recognizing situations that cause stress.   Developing a plan to manage stress.  HOME CARE INSTRUCTIONS   Only take over-the-counter or  prescription medicines as directed by your health care provider.   Do not use anything on the skin without checking with your health care provider.   Keep baths or showers short (5 minutes) in warm (not hot) water. Use mild cleansers for bathing. These should be unscented. You may add nonperfumed bath oil to the bath water. It is best to avoid soap and bubble bath.   Immediately after a bath or shower, when the skin is still damp, apply a moisturizing ointment to the entire body. This ointment should be a petroleum ointment. This will seal in moisture and help prevent dryness. The thicker the ointment, the better. These should be unscented.   Keep fingernails cut short. Children with eczema may need to wear soft gloves or mittens at night after applying an ointment.   Dress in clothes made of cotton or cotton blends. Dress lightly, because heat increases itching.   A child with eczema should stay away from anyone with fever blisters or cold sores. The virus that causes fever blisters (herpes simplex) can cause a serious skin infection in children with eczema. SEEK MEDICAL CARE IF:   Your itching interferes with sleep.   Your rash gets worse or is not better within 1 week after starting treatment.   You see pus or soft yellow scabs in the rash area.   You have a fever.   You have a rash flare-up after contact with someone who has fever blisters.  Document Released: 07/11/2000 Document Revised: 05/04/2013 Document Reviewed: 02/14/2013 [employer] Patient Information 2015 [employer]. This information   is not intended to replace advice given to you by your health care provider. Make sure you discuss any questions you have with your health care provider.  

## 2014-02-01 NOTE — ED Notes (Signed)
Pt escorted to discharge window. Verbalized understanding discharge instructions. In no acute distress.
